# Patient Record
Sex: Female | Born: 1945 | Race: White | Hispanic: No | Marital: Married | Smoking: Current every day smoker
Health system: Southern US, Community
[De-identification: ages and names within clinical notes are randomized; demographics above are authoritative.]

## PROBLEM LIST (undated history)

## (undated) DIAGNOSIS — E079 Disorder of thyroid, unspecified: Secondary | ICD-10-CM

## (undated) DIAGNOSIS — I10 Essential (primary) hypertension: Secondary | ICD-10-CM

## (undated) HISTORY — PX: ANTERIOR VITRECTOMY: SHX1173

## (undated) HISTORY — PX: TONSILLECTOMY: SUR1361

## (undated) HISTORY — PX: CHOLECYSTECTOMY: SHX55

## (undated) HISTORY — PX: CATARACT EXTRACTION: SUR2

---

## 2018-05-28 ENCOUNTER — Ambulatory Visit: Payer: Medicare Other

## 2018-05-28 ENCOUNTER — Ambulatory Visit
Admission: EM | Admit: 2018-05-28 | Discharge: 2018-05-28 | Disposition: A | Discharge status: Home / Self Care | Payer: Medicare Other | Attending: Family Medicine | Admitting: Family Medicine

## 2018-05-28 ENCOUNTER — Other Ambulatory Visit: Payer: Self-pay

## 2018-05-28 DIAGNOSIS — L539 Erythematous condition, unspecified: Secondary | ICD-10-CM | POA: Diagnosis present

## 2018-05-28 DIAGNOSIS — M79672 Pain in left foot: Secondary | ICD-10-CM | POA: Insufficient documentation

## 2018-05-28 DIAGNOSIS — L538 Other specified erythematous conditions: Secondary | ICD-10-CM | POA: Diagnosis not present

## 2018-05-28 HISTORY — DX: Essential (primary) hypertension: I10

## 2018-05-28 HISTORY — DX: Disorder of thyroid, unspecified: E07.9

## 2018-05-28 MED ORDER — MELOXICAM 15 MG PO TABS: 15.0000 mg | ORAL_TABLET | Freq: Every day | ORAL | 0 refills | Status: AC

## 2018-05-28 MED ORDER — SULFAMETHOXAZOLE-TRIMETHOPRIM 800-160 MG PO TABS: 1.0000 | ORAL_TABLET | Freq: Two times a day (BID) | ORAL | 0 refills | Status: DC

## 2018-05-28 NOTE — Discharge Instructions (Signed)
Metatarsal pads/shoe insert Follow up with podiatrist

## 2018-05-28 NOTE — ED Triage Notes (Signed)
Patient complains of left foot pain that started originally 5 weeks ago as a soreness. Patient states that over last 2 days pain has been worse, redness at base of 4th and 5th toes and soreness in between the toes as well. Patient states that this started after buying new shoes to walk in.

## 2018-05-28 NOTE — ED Provider Notes (Signed)
[facility]    CSN: [id number] Arrival date & time: 05/28/18  5997     History   Chief Complaint Chief Complaint  Patient presents with  . Foot Pain    left    HPI Carrie Edwards is a 73 y.o. female.   73 yo female with a c/o left foot pain for the past month. Denies any falls or direct trauma. States she walks a lot and about 6 weeks ago she got some new walking tennis shoes but not sure if this is related. Denies any rash, fevers, chills.   The history is provided by the patient.  Foot Pain     Past Medical History:  Diagnosis Date  . Hypertension   . Thyroid disease     There are no active problems to display for this patient.   Past Surgical History:  Procedure Laterality Date  . ANTERIOR VITRECTOMY Bilateral   . CATARACT EXTRACTION Bilateral   . CHOLECYSTECTOMY    . TONSILLECTOMY      OB History   No obstetric history on file.      Home Medications    Prior to Admission medications   Medication Sig Start Date End Date Taking? Authorizing Provider  Calcium-Magnesium-Vitamin D (CITRACAL CALCIUM+D) 600-40-500 MG-MG-UNIT TB24 Take by mouth. 04/07/11  Yes [provider]  Glucosamine Sulfate 500 MG TABS Take by mouth. 09/06/10  Yes [provider]  hydrochlorothiazide (MICROZIDE) 12.5 MG capsule  03/05/18  Yes [provider]  levothyroxine (SYNTHROID, LEVOTHROID) 100 MCG tablet  04/19/18  Yes [provider]  lisinopril (PRINIVIL,ZESTRIL) 40 MG tablet  04/19/18  Yes [provider]  Multiple Vitamin (MULTIVITAMIN) capsule Take by mouth. 06/27/10  Yes [provider]  TOPROL XL 50 MG 24 hr tablet  03/23/18  Yes [provider]  meloxicam (MOBIC) 15 MG tablet Take 1 tablet (15 mg total) by mouth daily. 05/28/18   Payton Mccallum, MD  sulfamethoxazole-trimethoprim (BACTRIM DS,SEPTRA DS) 800-160 MG tablet Take 1 tablet by mouth 2 (two) times daily. 05/28/18   Payton Mccallum, MD     Family History Family History  Problem Relation Age of Onset  . Hypertension Mother   . Hypertension Father   . Emphysema Father     Social History Social History   Tobacco Use  . Smoking status: Current Every Day Smoker    Packs/day: 0.50    Types: Cigarettes  . Smokeless tobacco: Never Used  Substance Use Topics  . Alcohol use: Not Currently  . Drug use: Not Currently     Allergies   Patient has no known allergies.   Review of Systems Review of Systems   Physical Exam Triage Vital Signs ED Triage Vitals  Enc Vitals Group     BP 05/28/18 0843 (!) 178/88     Pulse Rate 05/28/18 0843 81     Resp 05/28/18 0843 18     Temp 05/28/18 0843 97.8 F (36.6 C)     Temp Source 05/28/18 0843 Oral     SpO2 05/28/18 0843 99 %     Weight 05/28/18 0838 140 lb (63.5 kg)     Height 05/28/18 0838 4' 11.75" (1.518 m)     Head Circumference --      Peak Flow --      Pain Score 05/28/18 0838 3     Pain Loc --      Pain Edu? --      Excl. in GC? --    No  data found.  Updated Vital Signs BP (!) 178/88 (BP Location: Left Arm)   Pulse 81   Temp 97.8 F (36.6 C) (Oral)   Resp 18   Ht 4' 11.75" (1.518 m)   Wt 63.5 kg   SpO2 99%   BMI 27.57 kg/m   Visual Acuity Right Eye Distance:   Left Eye Distance:   Bilateral Distance:    Right Eye Near:   Left Eye Near:    Bilateral Near:     Physical Exam Vitals signs and nursing note reviewed.  Constitutional:      General: She is not in acute distress.    Appearance: She is not diaphoretic.  Musculoskeletal:     Left foot: Normal range of motion and normal capillary refill. Tenderness (between distal 4th and 5th metatarsals), bony tenderness and swelling (mild with mild erythema) present. No crepitus, deformity or laceration.  Neurological:     Mental Status: She is alert.      UC Treatments / Results  Labs (all labs ordered are listed, but only abnormal results are displayed) Labs Reviewed - No data to  display  EKG None  Radiology Dg Toe 4th Left  Result Date: 05/28/2018 CLINICAL DATA:  Pain between the fourth and fifth toe for 5 weeks. EXAM: LEFT FOURTH TOE COMPARISON:  None. FINDINGS: There is no evidence of fracture or dislocation. Fusion of the fourth distal interphalangeal joint is identified. Soft tissues are unremarkable. IMPRESSION: No acute fracture or dislocation is noted. Electronically Signed   By: Sherian Rein M.D.   On: 05/28/2018 09:38   Dg Toe 5th Left  Result Date: 05/28/2018 CLINICAL DATA:  Pain of the left fourth and fifth toes. EXAM: DG TOE 5TH LEFT COMPARISON:  None. FINDINGS: There is no evidence of fracture or dislocation. Soft tissues are unremarkable. IMPRESSION: No acute abnormality identified. Electronically Signed   By: Sherian Rein M.D.   On: 05/28/2018 09:39    Procedures Procedures (including critical care time)  Medications Ordered in UC Medications - No data to display  Initial Impression / Assessment and Plan / UC Course  I have reviewed the triage vital signs and the nursing notes.  Pertinent labs & imaging results that were available during my care of the patient were reviewed by me and considered in my medical decision making (see chart for details).      Final Clinical Impressions(s) / UC Diagnoses   Final diagnoses:  Foot pain, left  Redness of skin     Discharge Instructions     Metatarsal pads/shoe insert Follow up with podiatrist    ED Prescriptions    Medication Sig Dispense Auth. Provider   meloxicam (MOBIC) 15 MG tablet Take 1 tablet (15 mg total) by mouth daily. 30 tablet Payton Mccallum, MD   sulfamethoxazole-trimethoprim (BACTRIM DS,SEPTRA DS) 800-160 MG tablet Take 1 tablet by mouth 2 (two) times daily. 14 tablet Jackson Fetters, Pamala Hurry, MD      1. x-ray results (negative) and diagnosis reviewed with patient 2. rx as per orders above; reviewed possible side effects, interactions, risks and benefits (empiric tx) 3.  Recommend supportive treatment as above 4. Follow-up prn if symptoms worsen or don't improve  Controlled Substance Prescriptions [place] Controlled Substance Registry consulted? Not Applicable   Payton Mccallum, MD 05/28/18 [id number]

## 2018-06-01 ENCOUNTER — Ambulatory Visit
Admission: EM | Admit: 2018-06-01 | Discharge: 2018-06-01 | Disposition: A | Discharge status: Home / Self Care | Payer: Medicare Other | Attending: Emergency Medicine | Admitting: Emergency Medicine

## 2018-06-01 ENCOUNTER — Encounter: Payer: Self-pay | Admitting: Gynecology

## 2018-06-01 ENCOUNTER — Other Ambulatory Visit: Payer: Self-pay

## 2018-06-01 DIAGNOSIS — L02612 Cutaneous abscess of left foot: Secondary | ICD-10-CM | POA: Insufficient documentation

## 2018-06-01 MED ORDER — DOXYCYCLINE HYCLATE 100 MG PO CAPS: 100.0000 mg | ORAL_CAPSULE | Freq: Two times a day (BID) | ORAL | 0 refills | Status: DC

## 2018-06-01 MED ORDER — MUPIROCIN 2 % EX OINT: 1.0000 "application " | TOPICAL_OINTMENT | Freq: Three times a day (TID) | CUTANEOUS | 0 refills | Status: DC

## 2018-06-01 NOTE — ED Notes (Signed)
Bacitracin and dressing to wound in between 4th and 5th toes left foot

## 2018-06-01 NOTE — ED Triage Notes (Signed)
Per patient c/o was seen x 3 days ago for her right foot pain. Per patient getting worse

## 2018-06-01 NOTE — Discharge Instructions (Addendum)
Wet a washcloth under the faucet and then place in the microwave oven for 10 to 15 seconds.  Place the cloth over the abscess leaving it there for 10 minutes.  Dry the area thoroughly and apply mupirocin ointment to all areas of hardness/redness.  Perform this 3-4 times each and every day until the abscess has resolved.  Be certain to take all of the doxycycline.  If the area appears worsening return to our clinic or go to podiatrist.

## 2018-06-01 NOTE — ED Provider Notes (Signed)
[facility]    CSN: [id number] Arrival date & time: 06/01/18  [id number]     History   Chief Complaint Chief Complaint  Patient presents with  . Foot Pain    HPI Carrie Edwards is a 73 y.o. female.   HPI  -year-old female presents after being seen here on 05/28/2018 of left foot pain.  In between the fourth and fifth toes in the webspace.  The placed  on Septra for possible cellulitis.  Returns today stating that her foot seems to be worsening.  She has noticed ecchymosis along the edges.  She also noticed a tear type in the webspace between the fourth and fifth toes.  No fever or chills.  She states that the tenderness that she had 3 days ago was not as bad.         Past Medical History:  Diagnosis Date  . Hypertension   . Thyroid disease     There are no active problems to display for this patient.   Past Surgical History:  Procedure Laterality Date  . ANTERIOR VITRECTOMY Bilateral   . CATARACT EXTRACTION Bilateral   . CHOLECYSTECTOMY    . TONSILLECTOMY      OB History   No obstetric history on file.      Home Medications    Prior to Admission medications   Medication Sig Start Date End Date Taking? Authorizing Provider  Calcium-Magnesium-Vitamin D (CITRACAL CALCIUM+D) 600-40-500 MG-MG-UNIT TB24 Take by mouth. 04/07/11  Yes [provider]  Glucosamine Sulfate 500 MG TABS Take by mouth. 09/06/10  Yes [provider]  hydrochlorothiazide (MICROZIDE) 12.5 MG capsule  03/05/18  Yes [provider]  ibuprofen (ADVIL,MOTRIN) 400 MG tablet Take by mouth. 04/09/12  Yes [provider]  levothyroxine (SYNTHROID, LEVOTHROID) 100 MCG tablet  04/19/18  Yes [provider]  lisinopril (PRINIVIL,ZESTRIL) 40 MG tablet  04/19/18  Yes [provider]  meloxicam (MOBIC) 15 MG tablet Take 1 tablet (15 mg total) by mouth daily. 05/28/18  Yes Payton Mccallumonty, [place], MD  Multiple Vitamin (MULTIVITAMIN) capsule Take by  mouth. 06/27/10  Yes [provider]  sulfamethoxazole-trimethoprim (BACTRIM DS,SEPTRA DS) 800-160 MG tablet Take 1 tablet by mouth 2 (two) times daily. 05/28/18  Yes Payton Mccallumonty, [place], MD  TOPROL XL 50 MG 24 hr tablet  03/23/18  Yes [provider]  doxycycline (VIBRAMYCIN) 100 MG capsule Take 1 capsule (100 mg total) by mouth 2 (two) times daily. 06/01/18   Lutricia Feiloemer, Russel Morain P, PA-C  mupirocin ointment (BACTROBAN) 2 % Apply 1 application topically 3 (three) times daily. 06/01/18   Lutricia Feiloemer, Jason Frisbee P, PA-C    Family History Family History  Problem Relation Age of Onset  . Hypertension Mother   . Hypertension Father   . Emphysema Father     Social History Social History   Tobacco Use  . Smoking status: Current Every Day Smoker    Packs/day: 0.50    Types: Cigarettes  . Smokeless tobacco: Never Used  Substance Use Topics  . Alcohol use: Not Currently  . Drug use: Not Currently     Allergies   Tyropanoate   Review of Systems Review of Systems  Constitutional: Positive for activity change. Negative for appetite change, chills, fatigue and fever.  Skin: Positive for color change.  All other systems reviewed and are negative.    Physical Exam Triage Vital Signs ED Triage Vitals [06/01/18 1043]  Enc Vitals Group     BP 129/65     Pulse  Rate 81     Resp 16     Temp 98.2 F (36.8 C)     Temp Source Oral     SpO2 98 %     Weight 140 lb (63.5 kg)     Height      Head Circumference      Peak Flow      Pain Score 4     Pain Loc      Pain Edu?      Excl. in GC?    No data found.  Updated Vital Signs BP 129/65 (BP Location: Left Arm)   Pulse 81   Temp 98.2 F (36.8 C) (Oral)   Resp 16   Wt 140 lb (63.5 kg)   SpO2 98%   BMI 27.57 kg/m   Visual Acuity Right Eye Distance:   Left Eye Distance:   Bilateral Distance:    Right Eye Near:   Left Eye Near:    Bilateral Near:     Physical Exam Vitals signs and nursing note reviewed.  Constitutional:       General: She is not in acute distress.    Appearance: Normal appearance. She is not ill-appearing or toxic-appearing.  HENT:     Head: Normocephalic.  Musculoskeletal: Normal range of motion.        General: Tenderness present.  Skin:    General: Skin is warm and dry.     Findings: Erythema present.     Comments: Examination of the left foot space of the fourth and fifth toes is erythema of the distal foot spreading proximally.  He mildly blanchable.  The webspace itself the patient has a fluctuant area.  Gentle squeezing I was able to drain a modert amount of purulence from the area.  Mild tenderness to palpation but is not nearly as bad as she had 3 days ago.  Neurological:     General: No focal deficit present.     Mental Status: She is alert and oriented to person, place, and time.  Psychiatric:        Mood and Affect: Mood normal.        Behavior: Behavior normal.        Thought Content: Thought content normal.        Judgment: Judgment normal.      UC Treatments / Results  Labs (all labs ordered are listed, but only abnormal results are displayed) Labs Reviewed - No data to display  EKG None  Radiology No results found.  Procedures The area between the fourth and fifth toes in the webspace fluctuant area that was gently compressed digitally a moderate amount of purulence being expressed.  There is continually compressed until further purulence was expressed.  She did have of bleeding afterwards.  The area was cleansed antibiotic ointment placed on in the between the toes and a dry sterile dressing applied.  Medications Ordered in UC Medications - No data to display  Initial Impression / Assessment and Plan / UC Course  I have reviewed the triage vital signs and the nursing notes.  Pertinent labs & imaging results that were available during my care of the patient were reviewed by me and considered in my medical decision making (see chart for details).   Has an  appointment with Dr. Ether Griffins on Monday.  In the meantime I will have her elevate her leg above the level of her heart to control swelling and pain.  Have switched her from the Bactrim to doxycycline  and will have her apply warm compresses 3 times daily dry thoroughly and apply Bactroban ointment.  She will keep a dry sterile dressing in place.  Because of the site have recommended she follow-up with Dr.Fowler on her appointment.   Final Clinical Impressions(s) / UC Diagnoses   Final diagnoses:  Abscess of left foot     Discharge Instructions     Wet a washcloth under the faucet and then place in the microwave oven for 10 to 15 seconds.  Place the cloth over the abscess leaving it there for 10 minutes.  Dry the area thoroughly and apply mupirocin ointment to all areas of hardness/redness.  Perform this 3-4 times each and every day until the abscess has resolved.  Be certain to take all of the doxycycline.  If the area appears worsening return to our clinic or go to podiatrist.    ED Prescriptions    Medication Sig Dispense Auth. Provider   doxycycline (VIBRAMYCIN) 100 MG capsule Take 1 capsule (100 mg total) by mouth 2 (two) times daily. 14 capsule Ovid Curd P, PA-C   mupirocin ointment (BACTROBAN) 2 % Apply 1 application topically 3 (three) times daily. 22 g Lutricia Feil, PA-C     Controlled Substance Prescriptions [place] Controlled Substance Registry consulted? Not Applicable   Lutricia Feil, PA-C 06/01/18 1329

## 2019-07-06 IMAGING — CR DG TOE 4TH 2+V*L*
3 series · 3 of 3 positions shown · non-contrast
Comparison: None.

CLINICAL DATA: Pain between the fourth and fifth toe for 5 weeks.

EXAM:
LEFT FOURTH TOE

[toe ap]
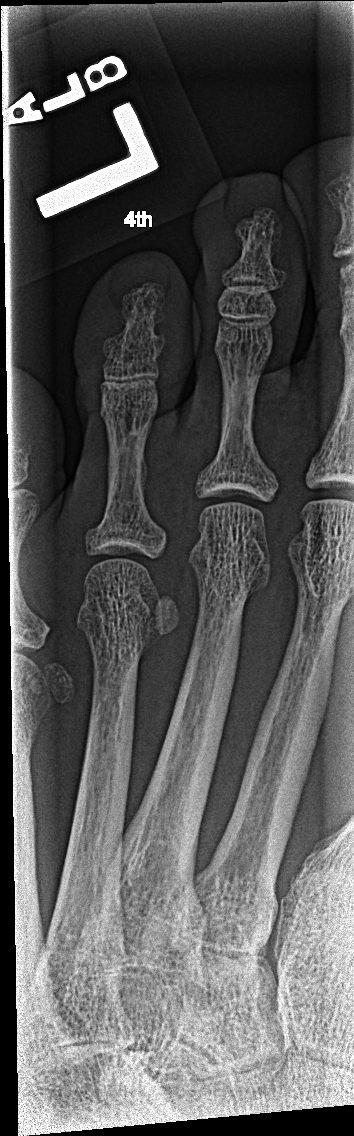

[toe obl]
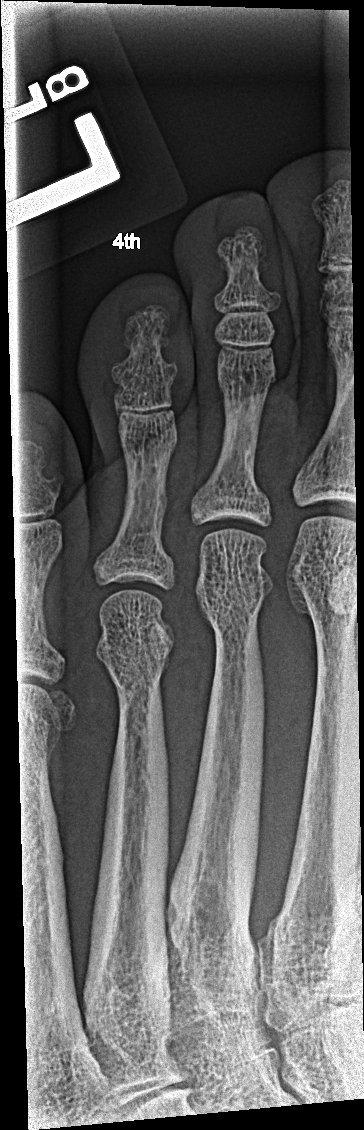

[toe lat]
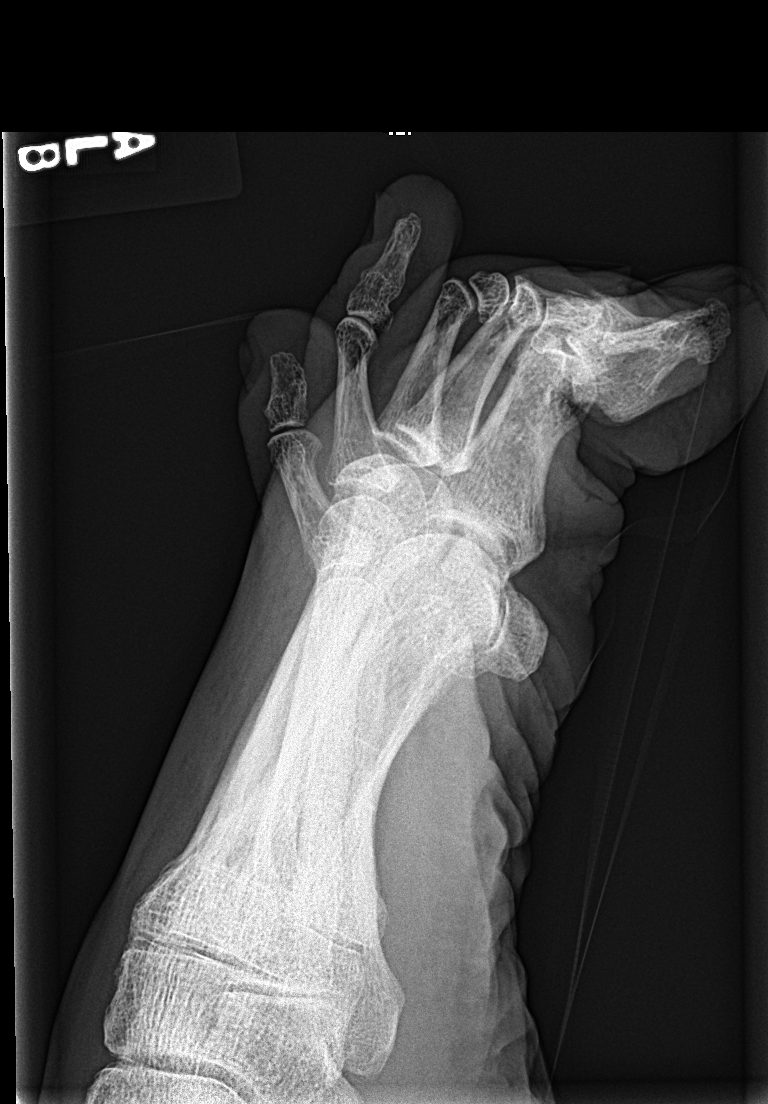

[3 of 3 positions shown; findings below may reference images not displayed]

FINDINGS: There is no evidence of fracture or dislocation. Fusion of the
fourth distal interphalangeal joint is identified. Soft tissues are
unremarkable.
IMPRESSION: No acute fracture or dislocation is noted.

## 2021-05-27 ENCOUNTER — Other Ambulatory Visit: Payer: Self-pay

## 2021-05-27 ENCOUNTER — Ambulatory Visit
Admission: EM | Admit: 2021-05-27 | Discharge: 2021-05-27 | Disposition: A | Discharge status: Home / Self Care | Payer: Medicare Other

## 2021-05-27 ENCOUNTER — Encounter: Payer: Self-pay | Admitting: Emergency Medicine

## 2021-05-27 DIAGNOSIS — U071 COVID-19: Secondary | ICD-10-CM | POA: Diagnosis not present

## 2021-05-27 MED ORDER — MOLNUPIRAVIR EUA 200MG CAPSULE: 4.0000 | ORAL_CAPSULE | Freq: Two times a day (BID) | ORAL | 0 refills | Status: AC

## 2021-05-27 MED ORDER — MOLNUPIRAVIR EUA 200MG CAPSULE: 4.0000 | ORAL_CAPSULE | Freq: Two times a day (BID) | ORAL | 0 refills | Status: DC

## 2021-05-27 NOTE — ED Provider Notes (Addendum)
[facility]    CSN: [id number] Arrival date & time: 05/27/21  1900      History   Chief Complaint Chief Complaint  Patient presents with   Covid Positive   Cough    HPI Carrie Edwards is a 76 y.o. female.   Patient presents with nonproductive cough, diarrhea, body aches, chills, rhinorrhea and nasal congestion beginning 1 day ago after visit with a friend.  No known COVID contacts.  Home COVID test positive today.  Has attempted use of Tylenol and Motrin which has been somewhat helpful.  History of hypertension and hypothyroidism.  Daily smoker.  Has received COVID vaccinations and booster.  Past Medical History:  Diagnosis Date   Hypertension    Thyroid disease     There are no problems to display for this patient.   Past Surgical History:  Procedure Laterality Date   ANTERIOR VITRECTOMY Bilateral    CATARACT EXTRACTION Bilateral    CHOLECYSTECTOMY     TONSILLECTOMY      OB History   No obstetric history on file.      Home Medications    Prior to Admission medications   Medication Sig Start Date End Date Taking? Authorizing Provider  nebivolol (BYSTOLIC) 10 MG tablet Take 1 tablet by mouth daily. 05/16/21  Yes [provider]  Calcium-Magnesium-Vitamin D (CITRACAL CALCIUM+D) 600-40-500 MG-MG-UNIT TB24 Take by mouth. 04/07/11   [provider]  doxycycline (VIBRAMYCIN) 100 MG capsule Take 1 capsule (100 mg total) by mouth 2 (two) times daily. 06/01/18   Lutricia Feiloemer, William P, PA-C  Glucosamine Sulfate 500 MG TABS Take by mouth. 09/06/10   [provider]  hydrochlorothiazide (MICROZIDE) 12.5 MG capsule  03/05/18   [provider]  ibuprofen (ADVIL,MOTRIN) 400 MG tablet Take by mouth. 04/09/12   [provider]  levothyroxine (SYNTHROID, LEVOTHROID) 100 MCG tablet  04/19/18   [provider]  lisinopril (PRINIVIL,ZESTRIL) 40 MG tablet  04/19/18   [provider]  meloxicam (MOBIC) 15 MG tablet  Take 1 tablet (15 mg total) by mouth daily. 05/28/18   Payton Mccallumonty, [place], MD  Multiple Vitamin (MULTIVITAMIN) capsule Take by mouth. 06/27/10   [provider]  mupirocin ointment (BACTROBAN) 2 % Apply 1 application topically 3 (three) times daily. 06/01/18   Lutricia Feiloemer, William P, PA-C  RESTASIS 0.05 % ophthalmic emulsion  04/13/21   [provider]  sulfamethoxazole-trimethoprim (BACTRIM DS,SEPTRA DS) 800-160 MG tablet Take 1 tablet by mouth 2 (two) times daily. 05/28/18   Payton Mccallumonty, [place], MD  TOPROL XL 50 MG 24 hr tablet  03/23/18   [provider]    Family History Family History  Problem Relation Age of Onset   Hypertension Mother    Hypertension Father    Emphysema Father     Social History Social History   Tobacco Use   Smoking status: Every Day    Packs/day: 0.50    Types: Cigarettes   Smokeless tobacco: Never  Vaping Use   Vaping Use: Never used  Substance Use Topics   Alcohol use: Not Currently   Drug use: Not Currently     Allergies   Tyropanoate   Review of Systems Review of Systems  Constitutional:  Positive for chills. Negative for activity change, appetite change, diaphoresis, fatigue, fever and unexpected weight change.  HENT:  Positive for congestion and rhinorrhea. Negative for dental problem, drooling, ear discharge, ear pain, facial swelling, hearing loss, mouth sores, nosebleeds, postnasal drip, sinus pressure, sinus pain, sneezing, sore throat,  tinnitus, trouble swallowing and voice change.   Respiratory:  Positive for cough. Negative for apnea, choking, chest tightness, shortness of breath, wheezing and stridor.   Cardiovascular: Negative.   Gastrointestinal:  Positive for diarrhea. Negative for abdominal distention, abdominal pain, anal bleeding, blood in stool, constipation, nausea, rectal pain and vomiting.  Musculoskeletal:  Positive for myalgias. Negative for arthralgias, back pain, gait problem, joint swelling, neck pain and neck  stiffness.  Skin: Negative.   Neurological: Negative.     Physical Exam Triage Vital Signs ED Triage Vitals  Enc Vitals Group     BP 05/27/21 1913 (!) 183/77     Pulse Rate 05/27/21 1913 79     Resp 05/27/21 1913 14     Temp 05/27/21 1913 98.4 F (36.9 C)     Temp Source 05/27/21 1913 Oral     SpO2 05/27/21 1913 97 %     Weight 05/27/21 1910 122 lb (55.3 kg)     Height 05/27/21 1910 4\' 11"  (1.499 m)     Head Circumference --      Peak Flow --      Pain Score 05/27/21 1909 4     Pain Loc --      Pain Edu? --      Excl. in GC? --    No data found.  Updated Vital Signs BP (!) 183/77 (BP Location: Left Arm)    Pulse 79    Temp 98.4 F (36.9 C) (Oral)    Resp 14    Ht 4\' 11"  (1.499 m)    Wt 122 lb (55.3 kg)    SpO2 97%    BMI 24.64 kg/m   Visual Acuity Right Eye Distance:   Left Eye Distance:   Bilateral Distance:    Right Eye Near:   Left Eye Near:    Bilateral Near:     Physical Exam Constitutional:      Appearance: Normal appearance.  HENT:     Head: Normocephalic.     Right Ear: Tympanic membrane, ear canal and external ear normal.     Left Ear: Tympanic membrane, ear canal and external ear normal.     Nose: Congestion and rhinorrhea present.     Mouth/Throat:     Mouth: Mucous membranes are moist.     Pharynx: Posterior oropharyngeal erythema present.  Eyes:     Extraocular Movements: Extraocular movements intact.  Cardiovascular:     Rate and Rhythm: Normal rate and regular rhythm.     Pulses: Normal pulses.     Heart sounds: Normal heart sounds.  Pulmonary:     Effort: Pulmonary effort is normal.     Breath sounds: Normal breath sounds.  Musculoskeletal:     Cervical back: Normal range of motion and neck supple.  Skin:    General: Skin is warm and dry.  Neurological:     Mental Status: She is alert and oriented to person, place, and time. Mental status is at baseline.  Psychiatric:        Mood and Affect: Mood normal.        Behavior: Behavior  normal.     UC Treatments / Results  Labs (all labs ordered are listed, but only abnormal results are displayed) Labs Reviewed - No data to display  EKG   Radiology No results found.  Procedures Procedures (including critical care time)  Medications Ordered in UC Medications - No data to display  Initial Impression / Assessment and Plan / UC Course  I have reviewed the triage vital signs and the nursing notes.  Pertinent labs & imaging results that were available during my care of the patient were reviewed by me and considered in my medical decision making (see chart for details).  COVID-19  Vital signs stable, patient in no signs of distress, home COVID test positive with known contact, will not retest, stable for outpatient treatment , prescribed molnupiravir twice daily for 5 days, patient to use over-the-counter medications for remaining symptom management, urgent care follow-up as needed  Final Clinical Impressions(s) / UC Diagnoses   Final diagnoses:  None   Discharge Instructions   None    ED Prescriptions   None    PDMP not reviewed this encounter.   Valinda Hoar, NP 05/27/21 1928    Valinda Hoar, NP 05/27/21 1929

## 2021-05-27 NOTE — Discharge Instructions (Signed)
please continue to quarantine for at least 5 days from your symptom, please quarantine until Tuesday, 05/31/20.   Take molnupiravir twice a  day for 5 days.     You can take Tylenol and/or Ibuprofen as needed for fever reduction and pain relief.   For cough: honey 1/2 to 1 teaspoon (you can dilute the honey in water or another fluid).  You can also use guaifenesin and dextromethorphan for cough. You can use a humidifier for chest congestion and cough.  If you don't have a humidifier, you can sit in the bathroom with the hot shower running.      For sore throat: try warm salt water gargles, cepacol lozenges, throat spray, warm tea or water with lemon/honey, popsicles or ice, or OTC cold relief medicine for throat discomfort.   For congestion: take a daily anti-histamine like Zyrtec, Claritin, and a oral decongestant, such as pseudoephedrine.  You can also use Flonase 1-2 sprays in each nostril daily.   It is important to stay hydrated: drink plenty of fluids (water, gatorade/powerade/pedialyte, juices, or teas) to keep your throat moisturized and help further relieve irritation/discomfort.

## 2021-05-27 NOTE — ED Triage Notes (Signed)
Patient c/o cough, diarrhea, back pain, and runny nose that started yesterday.  Patient took home covid test today and was positive.

## 2022-12-07 DEATH — deceased
